# Patient Record
Sex: Male | Born: 1970 | Race: White | Hispanic: No | Marital: Married | Smoking: Current some day smoker
Health system: Southern US, Community
[De-identification: ages and names within clinical notes are randomized; demographics above are authoritative.]

## PROBLEM LIST (undated history)

## (undated) DIAGNOSIS — I2510 Atherosclerotic heart disease of native coronary artery without angina pectoris: Secondary | ICD-10-CM

## (undated) DIAGNOSIS — E785 Hyperlipidemia, unspecified: Secondary | ICD-10-CM

## (undated) DIAGNOSIS — E781 Pure hyperglyceridemia: Secondary | ICD-10-CM

## (undated) DIAGNOSIS — I10 Essential (primary) hypertension: Secondary | ICD-10-CM

## (undated) DIAGNOSIS — Z720 Tobacco use: Secondary | ICD-10-CM

---

## 2015-06-24 HISTORY — PX: ABLATION: SHX5711

## 2015-12-27 HISTORY — PX: CORONARY ARTERY BYPASS GRAFT: SHX141

## 2017-02-18 ENCOUNTER — Encounter (HOSPITAL_COMMUNITY): Payer: Self-pay

## 2017-02-18 ENCOUNTER — Inpatient Hospital Stay (HOSPITAL_COMMUNITY)
Admission: EM | Disposition: A | Discharge status: Home / Self Care | Payer: Self-pay | Source: Home / Self Care | Attending: Cardiovascular Disease

## 2017-02-18 ENCOUNTER — Emergency Department (HOSPITAL_COMMUNITY): Payer: Self-pay

## 2017-02-18 ENCOUNTER — Inpatient Hospital Stay (HOSPITAL_COMMUNITY)
Admission: EM | Admit: 2017-02-18 | Discharge: 2017-02-20 | DRG: 247 | Disposition: A | Discharge status: Home / Self Care | Payer: Self-pay | Attending: Cardiovascular Disease | Admitting: Cardiovascular Disease

## 2017-02-18 DIAGNOSIS — I10 Essential (primary) hypertension: Secondary | ICD-10-CM | POA: Diagnosis present

## 2017-02-18 DIAGNOSIS — I213 ST elevation (STEMI) myocardial infarction of unspecified site: Secondary | ICD-10-CM

## 2017-02-18 DIAGNOSIS — Z955 Presence of coronary angioplasty implant and graft: Secondary | ICD-10-CM

## 2017-02-18 DIAGNOSIS — K219 Gastro-esophageal reflux disease without esophagitis: Secondary | ICD-10-CM | POA: Diagnosis present

## 2017-02-18 DIAGNOSIS — E785 Hyperlipidemia, unspecified: Secondary | ICD-10-CM | POA: Diagnosis present

## 2017-02-18 DIAGNOSIS — F17210 Nicotine dependence, cigarettes, uncomplicated: Secondary | ICD-10-CM | POA: Diagnosis present

## 2017-02-18 DIAGNOSIS — Z79899 Other long term (current) drug therapy: Secondary | ICD-10-CM

## 2017-02-18 DIAGNOSIS — E781 Pure hyperglyceridemia: Secondary | ICD-10-CM | POA: Diagnosis present

## 2017-02-18 DIAGNOSIS — I2119 ST elevation (STEMI) myocardial infarction involving other coronary artery of inferior wall: Principal | ICD-10-CM | POA: Diagnosis present

## 2017-02-18 DIAGNOSIS — Z951 Presence of aortocoronary bypass graft: Secondary | ICD-10-CM

## 2017-02-18 DIAGNOSIS — Y832 Surgical operation with anastomosis, bypass or graft as the cause of abnormal reaction of the patient, or of later complication, without mention of misadventure at the time of the procedure: Secondary | ICD-10-CM | POA: Diagnosis present

## 2017-02-18 DIAGNOSIS — Z720 Tobacco use: Secondary | ICD-10-CM | POA: Diagnosis present

## 2017-02-18 DIAGNOSIS — I25810 Atherosclerosis of coronary artery bypass graft(s) without angina pectoris: Secondary | ICD-10-CM | POA: Diagnosis present

## 2017-02-18 DIAGNOSIS — I2111 ST elevation (STEMI) myocardial infarction involving right coronary artery: Secondary | ICD-10-CM

## 2017-02-18 DIAGNOSIS — T82855A Stenosis of coronary artery stent, initial encounter: Secondary | ICD-10-CM | POA: Diagnosis present

## 2017-02-18 DIAGNOSIS — I2510 Atherosclerotic heart disease of native coronary artery without angina pectoris: Secondary | ICD-10-CM

## 2017-02-18 DIAGNOSIS — Z7982 Long term (current) use of aspirin: Secondary | ICD-10-CM

## 2017-02-18 HISTORY — PX: LEFT HEART CATH AND CORONARY ANGIOGRAPHY: CATH118249

## 2017-02-18 HISTORY — DX: Essential (primary) hypertension: I10

## 2017-02-18 HISTORY — DX: Atherosclerotic heart disease of native coronary artery without angina pectoris: I25.10

## 2017-02-18 HISTORY — PX: CORONARY/GRAFT ACUTE MI REVASCULARIZATION: CATH118305

## 2017-02-18 HISTORY — DX: Pure hyperglyceridemia: E78.1

## 2017-02-18 HISTORY — DX: Hyperlipidemia, unspecified: E78.5

## 2017-02-18 HISTORY — DX: Tobacco use: Z72.0

## 2017-02-18 LAB — CBC
HCT: 49.8 % (ref 39.0–52.0)
Hemoglobin: 18 g/dL — ABNORMAL HIGH (ref 13.0–17.0)
MCH: 35.2 pg — AB (ref 26.0–34.0)
MCHC: 36.1 g/dL — AB (ref 30.0–36.0)
MCV: 97.3 fL (ref 78.0–100.0)
PLATELETS: 211 10*3/uL (ref 150–400)
RBC: 5.12 MIL/uL (ref 4.22–5.81)
RDW: 12.8 % (ref 11.5–15.5)
WBC: 11.3 10*3/uL — ABNORMAL HIGH (ref 4.0–10.5)

## 2017-02-18 LAB — BASIC METABOLIC PANEL
ANION GAP: 6 (ref 5–15)
BUN: 13 mg/dL (ref 6–20)
CO2: 24 mmol/L (ref 22–32)
CREATININE: 1.13 mg/dL (ref 0.61–1.24)
Calcium: 9.3 mg/dL (ref 8.9–10.3)
Chloride: 105 mmol/L (ref 101–111)
GLUCOSE: 145 mg/dL — AB (ref 65–99)
Potassium: 5.8 mmol/L — ABNORMAL HIGH (ref 3.5–5.1)
Sodium: 135 mmol/L (ref 135–145)

## 2017-02-18 LAB — I-STAT TROPONIN, ED: Troponin i, poc: 0.08 ng/mL (ref 0.00–0.08)

## 2017-02-18 LAB — POCT ACTIVATED CLOTTING TIME: ACTIVATED CLOTTING TIME: 510 s

## 2017-02-18 LAB — MRSA PCR SCREENING: MRSA BY PCR: NEGATIVE

## 2017-02-18 SURGERY — LEFT HEART CATH AND CORONARY ANGIOGRAPHY
Anesthesia: LOCAL

## 2017-02-18 MED ORDER — NITROGLYCERIN 1 MG/10 ML FOR IR/CATH LAB
INTRA_ARTERIAL | Status: AC
  Filled 2017-02-18: qty 10

## 2017-02-18 MED ORDER — SODIUM CHLORIDE 0.9 % IV SOLN
INTRAVENOUS | Status: DC | PRN
  Administered 2017-02-18 (×2): 1.75 mg/kg/h via INTRAVENOUS

## 2017-02-18 MED ORDER — HEPARIN SODIUM (PORCINE) 5000 UNIT/ML IJ SOLN
INTRAMUSCULAR | Status: AC
  Administered 2017-02-18: 4000 [IU]
  Filled 2017-02-18: qty 1

## 2017-02-18 MED ORDER — BIVALIRUDIN TRIFLUOROACETATE 250 MG IV SOLR
INTRAVENOUS | Status: AC
  Filled 2017-02-18: qty 250

## 2017-02-18 MED ORDER — ACETAMINOPHEN 325 MG PO TABS: 650.0000 mg | ORAL_TABLET | ORAL | Status: DC | PRN

## 2017-02-18 MED ORDER — METOPROLOL SUCCINATE ER 100 MG PO TB24
100.0000 mg | ORAL_TABLET | Freq: Every day | ORAL | Status: DC
  Administered 2017-02-19 – 2017-02-20 (×2): 100 mg via ORAL
  Filled 2017-02-18 (×2): qty 1

## 2017-02-18 MED ORDER — SODIUM CHLORIDE 0.9 % WEIGHT BASED INFUSION
1.0000 mL/kg/h | INTRAVENOUS | Status: AC
  Administered 2017-02-18 – 2017-02-19 (×2): 1 mL/kg/h via INTRAVENOUS

## 2017-02-18 MED ORDER — FENTANYL CITRATE (PF) 100 MCG/2ML IJ SOLN
INTRAMUSCULAR | Status: DC | PRN
  Administered 2017-02-18: 25 ug via INTRAVENOUS

## 2017-02-18 MED ORDER — IOPAMIDOL (ISOVUE-370) INJECTION 76%
INTRAVENOUS | Status: DC | PRN
  Administered 2017-02-18: 180 mL via INTRA_ARTERIAL

## 2017-02-18 MED ORDER — MIDAZOLAM HCL 2 MG/2ML IJ SOLN
INTRAMUSCULAR | Status: AC
  Filled 2017-02-18: qty 2

## 2017-02-18 MED ORDER — ATORVASTATIN CALCIUM 80 MG PO TABS: 80.0000 mg | ORAL_TABLET | Freq: Every day | ORAL | Status: DC

## 2017-02-18 MED ORDER — ASPIRIN 325 MG PO TABS: 325.0000 mg | ORAL_TABLET | Freq: Once | ORAL | Status: DC

## 2017-02-18 MED ORDER — LIDOCAINE HCL (PF) 1 % IJ SOLN
INTRAMUSCULAR | Status: AC
  Filled 2017-02-18: qty 30

## 2017-02-18 MED ORDER — SODIUM CHLORIDE 0.9% FLUSH
3.0000 mL | Freq: Two times a day (BID) | INTRAVENOUS | Status: DC
  Administered 2017-02-18 – 2017-02-20 (×3): 3 mL via INTRAVENOUS

## 2017-02-18 MED ORDER — ONDANSETRON HCL 4 MG/2ML IJ SOLN: 4.0000 mg | Freq: Four times a day (QID) | INTRAMUSCULAR | Status: DC | PRN

## 2017-02-18 MED ORDER — TICAGRELOR 90 MG PO TABS
90.0000 mg | ORAL_TABLET | Freq: Two times a day (BID) | ORAL | Status: DC
  Administered 2017-02-18 – 2017-02-20 (×4): 90 mg via ORAL
  Filled 2017-02-18 (×4): qty 1

## 2017-02-18 MED ORDER — NITROGLYCERIN 1 MG/10 ML FOR IR/CATH LAB
INTRA_ARTERIAL | Status: DC | PRN
  Administered 2017-02-18: 200 ug via INTRACORONARY

## 2017-02-18 MED ORDER — IOPAMIDOL (ISOVUE-370) INJECTION 76%
INTRAVENOUS | Status: AC
Start: 2017-02-18 — End: ?
  Filled 2017-02-18: qty 50

## 2017-02-18 MED ORDER — HEPARIN (PORCINE) IN NACL 2-0.9 UNIT/ML-% IJ SOLN
INTRAMUSCULAR | Status: AC
  Filled 2017-02-18: qty 1000

## 2017-02-18 MED ORDER — TICAGRELOR 90 MG PO TABS
ORAL_TABLET | ORAL | Status: AC
  Filled 2017-02-18: qty 2

## 2017-02-18 MED ORDER — MIDAZOLAM HCL 2 MG/2ML IJ SOLN
INTRAMUSCULAR | Status: DC | PRN
  Administered 2017-02-18: 1 mg via INTRAVENOUS

## 2017-02-18 MED ORDER — SODIUM CHLORIDE 0.9 % IV SOLN: 250.0000 mL | INTRAVENOUS | Status: DC | PRN

## 2017-02-18 MED ORDER — TICAGRELOR 90 MG PO TABS
ORAL_TABLET | ORAL | Status: DC | PRN
  Administered 2017-02-18: 180 mg via ORAL

## 2017-02-18 MED ORDER — ASPIRIN EC 81 MG PO TBEC
81.0000 mg | DELAYED_RELEASE_TABLET | Freq: Every day | ORAL | Status: DC
Start: 2017-02-19 — End: 2017-02-20
  Administered 2017-02-19 – 2017-02-20 (×2): 81 mg via ORAL
  Filled 2017-02-18 (×2): qty 1

## 2017-02-18 MED ORDER — BIVALIRUDIN BOLUS VIA INFUSION - CUPID
INTRAVENOUS | Status: DC | PRN
  Administered 2017-02-18: 95.925 mg via INTRAVENOUS

## 2017-02-18 MED ORDER — IOPAMIDOL (ISOVUE-370) INJECTION 76%
INTRAVENOUS | Status: AC
  Filled 2017-02-18: qty 125

## 2017-02-18 MED ORDER — ENOXAPARIN SODIUM 40 MG/0.4ML ~~LOC~~ SOLN
40.0000 mg | SUBCUTANEOUS | Status: DC
  Administered 2017-02-19: 40 mg via SUBCUTANEOUS
  Filled 2017-02-18 (×2): qty 0.4

## 2017-02-18 MED ORDER — VERAPAMIL HCL 2.5 MG/ML IV SOLN
INTRAVENOUS | Status: AC
  Filled 2017-02-18: qty 2

## 2017-02-18 MED ORDER — MORPHINE SULFATE (PF) 4 MG/ML IV SOLN
4.0000 mg | Freq: Once | INTRAVENOUS | Status: AC
  Administered 2017-02-18: 4 mg via INTRAVENOUS
  Filled 2017-02-18: qty 1

## 2017-02-18 MED ORDER — NITROGLYCERIN IN D5W 200-5 MCG/ML-% IV SOLN
0.0000 ug/min | Freq: Once | INTRAVENOUS | Status: AC
  Administered 2017-02-18: 5 ug/min via INTRAVENOUS
  Filled 2017-02-18: qty 250

## 2017-02-18 MED ORDER — LIDOCAINE HCL (PF) 1 % IJ SOLN
INTRAMUSCULAR | Status: DC | PRN
  Administered 2017-02-18: 18 mL

## 2017-02-18 MED ORDER — SODIUM CHLORIDE 0.9% FLUSH: 3.0000 mL | INTRAVENOUS | Status: DC | PRN

## 2017-02-18 MED ORDER — FENTANYL CITRATE (PF) 100 MCG/2ML IJ SOLN
INTRAMUSCULAR | Status: AC
  Filled 2017-02-18: qty 2

## 2017-02-18 SURGICAL SUPPLY — 15 items
BALLN EMERGE MR 2.5X12 (BALLOONS) ×2
BALLN SAPPHIRE ~~LOC~~ 3.25X12 (BALLOONS) ×2 IMPLANT
BALLOON EMERGE MR 2.5X12 (BALLOONS) ×1 IMPLANT
CATH INFINITI 5 FR IM (CATHETERS) ×2 IMPLANT
CATH INFINITI 5FR MULTPACK ANG (CATHETERS) ×2 IMPLANT
CATH VISTA GUIDE 6FR JR4 (CATHETERS) ×2 IMPLANT
KIT HEART LEFT (KITS) ×2 IMPLANT
PACK CARDIAC CATHETERIZATION (CUSTOM PROCEDURE TRAY) ×2 IMPLANT
SHEATH PINNACLE 6F 10CM (SHEATH) ×2 IMPLANT
STENT SIERRA 3.00 X 18 MM (Permanent Stent) ×2 IMPLANT
TRANSDUCER W/STOPCOCK (MISCELLANEOUS) ×2 IMPLANT
TUBING CIL FLEX 10 FLL-RA (TUBING) ×2 IMPLANT
WIRE EMERALD 3MM-J .035X150CM (WIRE) ×2 IMPLANT
WIRE EMERALD 3MM-J .035X260CM (WIRE) ×2 IMPLANT
WIRE RUNTHROUGH .014X180CM (WIRE) ×2 IMPLANT

## 2017-02-18 NOTE — H&P (Signed)
History & Physical    Patient ID: Danny Huber MRN: [id number], DOB/AGE: [birth year]   Admit date: 02/18/2017   Primary Physician: No primary care provider on file. Primary Cardiologist: [place]   Patient Profile    46 yo male with PMH of CAD s/p CABG 7/17 with ablation, HTN, and tobacco use who presented to the ED with chest pain. Code STEMI called.   Past Medical History   Past Medical History:  Diagnosis Date  . Angina pectoris syndrome (HCC)   . CAD (coronary artery disease)    7/17 CABG in [place]   . Hypertension   . Tobacco use     Past Surgical History:  Procedure Laterality Date  . ABLATION  2017   "2 weeks after the CABG"  . CORONARY ARTERY BYPASS GRAFT  12/27/2015     Allergies  No Known Allergies  History of Present Illness    [name] is a 46 yo male with PMH of CAD s/p CABG and ablation back in July of 2017 according to the patient. Other hx includes HTN and tobacco use. 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 Reports he developed chest pain this morning around 6am that awoke him from sleep and worsened when he was getting ready to take a shower. States the pain persisted which prompted him to call EMS present to the ED. Initial EKG on arrival showed SR with no acute ST changes. Repeat EKG at 1452 showed SR with ST elevation in the inferior leads. Code STEMI called in the ED. Given 324 asa, 4000 units of heparin, IV morphine and started on IV nitro. He was brought to the cath lab for emergent cardiac catheterization.   Home Medications    Prior to Admission medications   Medication Sig Start Date End Date Taking? Authorizing Provider  aspirin EC 81 MG tablet Take 81 mg by mouth daily.   Yes [provider]  isosorbide dinitrate (ISORDIL) 30 MG tablet Take 30 mg by mouth daily.    Yes [provider]  metoprolol succinate (TOPROL-XL) 100 MG 24 hr tablet Take 100 mg by mouth daily. Take with or  immediately following a meal.   Yes [provider]  sotalol (BETAPACE) 80 MG tablet Take 80 mg by mouth 2 (two) times daily.    Yes [provider]    Family History    Family History  Problem Relation Age of Onset  . Hypertension Father     Social History    Social History   Social History  . Marital status: N/A    Spouse name: N/A  . Number of children: N/A  . Years of education: N/A   Occupational History  . Not on file.   Social History Main Topics  . Smoking status: Current Some Day Smoker    Packs/day: 0.25    Types: Cigarettes  . Smokeless tobacco: Never Used  . Alcohol use No  . Drug use: No  . Sexual activity: Not on file   Other Topics Concern  . Not on file   Social History Narrative  . No narrative on file     Review of Systems    See HPI  All other systems reviewed and are otherwise negative except as noted above.  Physical Exam    Blood pressure 133/87, pulse 67, temperature 98 F (36.7 C), temperature source Oral, resp. rate 13, height 6\' 1"  (1.854 m), weight 282 lb (127.9 kg), SpO2 99 %.  General: Pleasant, obese WM NAD Psych: Normal affect. Neuro: Alert and oriented X 3. Moves all extremities spontaneously. HEENT: Normal  Neck: Supple without bruits or JVD. Lungs:  Resp regular and unlabored, CTA. Heart: RRR no s3, s4, or murmurs. Abdomen: Soft, non-tender, non-distended, BS + x 4.  Extremities: No clubbing, cyanosis or edema. DP/PT/Radials 2+ and equal bilaterally.  Labs    Troponin (Point of Care Test)  Recent Labs  02/18/17 1355  TROPIPOC 0.08   No results for input(s): CKTOTAL, CKMB, TROPONINI in the last 72 hours. Lab Results  Component Value Date   WBC 11.3 (H) 02/18/2017   HGB 18.0 (H) 02/18/2017   HCT 49.8 02/18/2017   MCV 97.3 02/18/2017   PLT 211 02/18/2017     Recent Labs Lab 02/18/17 1345  NA 135  K 5.8*  CL 105  CO2 24  BUN 13  CREATININE 1.13  CALCIUM 9.3  GLUCOSE 145*   No  results found for: CHOL, HDL, LDLCALC, TRIG No results found for: [facility]   Radiology Studies    Dg Chest 2 View  Result Date: 02/18/2017 CLINICAL DATA:  Chest pain EXAM: CHEST  2 VIEW COMPARISON:  None. FINDINGS: Intact sternotomy wires. CABG clips overlie the left mediastinum. Normal heart size. Normal mediastinal contour. No pneumothorax. No pleural effusion. Lungs appear clear, with no acute consolidative airspace disease and no pulmonary edema. IMPRESSION: No active cardiopulmonary disease. Electronically Signed   By: Delbert Phenix M.D.   On: 02/18/2017 14:21    ECG & Cardiac Imaging    EKG: SR with inferior ST elevation  Assessment & Plan    46 yo male with PMH of CAD s/p CABG 7/17 with ablation, HTN, and tobacco use who presented to the ED with chest pain. Code STEMI called.  STEMI: Presented with chest pain that started around 6am which persisted prompting him to come to the ED. Initial EKG showed SR with no ST changes, but follow up at 1452 showed inferior ST elevation. Given 324 asa, morphine, IV heparin and nitro. Brought to the lab for emergent cardiac cath.  -- further recommendations post cath  Signed, Laverda Page, NP-C Pager [phone] 02/18/2017, 3:42 PM

## 2017-02-18 NOTE — ED Provider Notes (Signed)
[facility] DE[facility] Provider Note   CSN: [id number] Arrival date & time: 02/18/17  1323     History   Chief Complaint Chief Complaint  Patient presents with  . Chest Pain    HPI Danny Huber is a 46 y.o. male with a history of CABG presents for chest pain that started at 6 AM. He reports his chest pain woke him a sleep.  He reports that he had one nitro with out relief.  He reports it is non radiating, does not change with breathing.  He reports that last year he had a CABG in [place] and needed an ablation after.  He reports that prior to having his CABG he had stents put in. He denies any N/V or SOB. He reports that he had a history of GERD, and did not take his medicine today.      HPI  Past Medical History:  Diagnosis Date  . Angina pectoris syndrome (HCC)   . Hypertension     There are no active problems to display for this patient.   Past Surgical History:  Procedure Laterality Date  . ABLATION  2017   "2 weeks after the CABG"  . CORONARY ARTERY BYPASS GRAFT  12/27/2015       Home Medications    Prior to Admission medications   Medication Sig Start Date End Date Taking? Authorizing Provider  aspirin EC 81 MG tablet Take 81 mg by mouth daily.   Yes [provider]  isosorbide dinitrate (ISORDIL) 30 MG tablet Take 30 mg by mouth daily.    Yes [provider]  metoprolol succinate (TOPROL-XL) 100 MG 24 hr tablet Take 100 mg by mouth daily. Take with or immediately following a meal.   Yes [provider]  sotalol (BETAPACE) 80 MG tablet Take 80 mg by mouth 2 (two) times daily.    Yes [provider]    Family History No family history on file.  Social History Social History  Substance Use Topics  . Smoking status: Current Some Day Smoker    Packs/day: 0.25    Types: Cigarettes  . Smokeless tobacco: Never Used  . Alcohol use No     Allergies   Patient has no known allergies.   Review of Systems Review of Systems   Constitutional: Negative for chills and fever.  HENT: Negative for ear pain and sore throat.   Eyes: Negative for pain and visual disturbance.  Respiratory: Positive for chest tightness. Negative for cough and shortness of breath.   Cardiovascular: Positive for chest pain. Negative for palpitations and leg swelling.  Gastrointestinal: Negative for abdominal pain, nausea and vomiting.  Genitourinary: Negative for dysuria and hematuria.  Musculoskeletal: Negative for arthralgias and back pain.  Skin: Negative for color change and rash.  Neurological: Negative for seizures and syncope.  All other systems reviewed and are negative.    Physical Exam Updated Vital Signs BP 133/87 (BP Location: Right Arm)   Pulse 67   Temp 98 F (36.7 C) (Oral)   Resp 13   Ht 6\' 1"  (1.854 m)   Wt 127.9 kg (282 lb)   SpO2 95%   BMI 37.21 kg/m   Physical Exam  Constitutional: He appears well-developed and well-nourished.  HENT:  Head: Normocephalic and atraumatic.  Eyes: Conjunctivae are normal.  Neck: Neck supple.  Cardiovascular: Normal rate, regular rhythm, normal heart sounds and intact distal pulses.  Exam reveals no gallop and no friction rub.   No murmur heard. Large midline  chest scar present.   Pulmonary/Chest: Effort normal and breath sounds normal. No respiratory distress. He has no wheezes. He exhibits no tenderness.  Abdominal: Soft. He exhibits no distension. There is no tenderness.  Musculoskeletal: He exhibits no edema.  Neurological: He is alert.  Skin: Skin is warm and dry.  Psychiatric: He has a normal mood and affect. His behavior is normal.  Nursing note and vitals reviewed.    ED Treatments / Results  Labs (all labs ordered are listed, but only abnormal results are displayed) Labs Reviewed  BASIC METABOLIC PANEL - Abnormal; Notable for the following:       Result Value   Potassium 5.8 (*)    Glucose, Bld 145 (*)    All other components within normal limits  CBC -  Abnormal; Notable for the following:    WBC 11.3 (*)    Hemoglobin 18.0 (*)    MCH 35.2 (*)    MCHC 36.1 (*)    All other components within normal limits  I-STAT TROPONIN, ED    EKG  EKG Interpretation  Date/Time:  Wednesday February 18 2017 14:52:18 EDT Ventricular Rate:  64 PR Interval:    QRS Duration: 98 QT Interval:  431 QTC Calculation: 445 R Axis:   104 Text Interpretation:  Sinus rhythm Inferior infarct, acute (RCA) Probable RV involvement, suggest recording right precordial leads +STEMI  Confirmed by Richardean CanalYao, David H [phone]([id number]) on 02/18/2017 2:58:05 PM       Radiology Dg Chest 2 View  Result Date: 02/18/2017 CLINICAL DATA:  Chest pain EXAM: CHEST  2 VIEW COMPARISON:  None. FINDINGS: Intact sternotomy wires. CABG clips overlie the left mediastinum. Normal heart size. Normal mediastinal contour. No pneumothorax. No pleural effusion. Lungs appear clear, with no acute consolidative airspace disease and no pulmonary edema. IMPRESSION: No active cardiopulmonary disease. Electronically Signed   By: Delbert PhenixJason A Poff M.D.   On: 02/18/2017 14:21    Procedures Procedures (including critical care time) CRITICAL CARE Performed by: Lyndel SafeElizabeth Athziry Millican Total critical care time: 33 minutes Critical care time was exclusive of separately billable procedures and treating other patients. Critical care was necessary to treat or prevent imminent or life-threatening deterioration. Critical care was time spent personally by me on the following activities: development of treatment plan with patient and/or surrogate as well as nursing, discussions with consultants, evaluation of patient's response to treatment, examination of patient, obtaining history from patient or surrogate, ordering and performing treatments and interventions, ordering and review of laboratory studies, ordering and review of radiographic studies, pulse oximetry and re-evaluation of patient's condition.   STEMI with urgent cardiac cath.     Medications Ordered in ED Medications  nitroGLYCERIN 50 mg in dextrose 5 % 250 mL (0.2 mg/mL) infusion (not administered)  heparin 5000 UNIT/ML injection (not administered)  aspirin tablet 325 mg (not administered)  morphine 4 MG/ML injection 4 mg (4 mg Intravenous Given 02/18/17 1501)     Initial Impression / Assessment and Plan / ED Course  I have reviewed the triage vital signs and the nursing notes.  Pertinent labs & imaging results that were available during my care of the patient were reviewed by me and considered in my medical decision making (see chart for details).  Clinical Course as of Feb 19 1504  Wed Feb 18, 2017  1418 Attempted to see patient, he has not in the room at this time.  [EH]    Clinical Course User Index [EH] Cristina GongHammond, Jesten Cappuccio W, PA-C   Jule Economyoger Davison  presents with chest pain that woke him up at 6 AM this morning.  Initial EKG and troponin were equivocal, labs were reviewed.  Repeat EKG was ordered based on high suspicion for ACS which showed Inferior STEMI.  Dr. Silverio Lay called code STEMI and started heparin, and nitro drip.  Dr. Silverio Lay consulted the code STEMI team who are planning to take patient to cath lab.  Patient was not hypotensive, he was on close cardiopulmonary monitoring while in the ED.    This patient was seen as a shared visit with Dr. Silverio Lay who evaluated the patient and agreed with my plan.     Final Clinical Impressions(s) / ED Diagnoses   Final diagnoses:  ST elevation myocardial infarction (STEMI), unspecified artery [facility])    New Prescriptions New Prescriptions   No medications on file     Norman Clay 02/18/17 1826    Charlynne Pander, MD 02/19/17 2158

## 2017-02-18 NOTE — ED Notes (Signed)
Code STEMi @14 :56

## 2017-02-18 NOTE — ED Triage Notes (Signed)
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 Pt hx of CABG last year, cath with stents. Pain now 2/10. 324 ASA, 1 Nitro without relief. 18G LAC, pt a/o VSS

## 2017-02-19 ENCOUNTER — Encounter (HOSPITAL_COMMUNITY): Payer: Self-pay

## 2017-02-19 ENCOUNTER — Inpatient Hospital Stay (HOSPITAL_COMMUNITY): Payer: Self-pay

## 2017-02-19 DIAGNOSIS — E785 Hyperlipidemia, unspecified: Secondary | ICD-10-CM

## 2017-02-19 DIAGNOSIS — I2119 ST elevation (STEMI) myocardial infarction involving other coronary artery of inferior wall: Secondary | ICD-10-CM

## 2017-02-19 DIAGNOSIS — Z716 Tobacco abuse counseling: Secondary | ICD-10-CM

## 2017-02-19 LAB — ECHOCARDIOGRAM COMPLETE
HEIGHTINCHES: 73 in
Weight: 4511.49 oz

## 2017-02-19 LAB — BASIC METABOLIC PANEL
ANION GAP: 10 (ref 5–15)
BUN: 9 mg/dL (ref 6–20)
CO2: 22 mmol/L (ref 22–32)
Calcium: 9.2 mg/dL (ref 8.9–10.3)
Chloride: 106 mmol/L (ref 101–111)
Creatinine, Ser: 0.95 mg/dL (ref 0.61–1.24)
GFR calc Af Amer: >60 mL/min (ref >60)
Glucose, Bld: 170 mg/dL — ABNORMAL HIGH (ref 65–99)
POTASSIUM: 3.9 mmol/L (ref 3.5–5.1)
SODIUM: 138 mmol/L (ref 135–145)

## 2017-02-19 LAB — CBC
HEMATOCRIT: 50.2 % (ref 39.0–52.0)
Hemoglobin: 17.5 g/dL — ABNORMAL HIGH (ref 13.0–17.0)
MCH: 34 pg (ref 26.0–34.0)
MCHC: 34.9 g/dL (ref 30.0–36.0)
MCV: 97.5 fL (ref 78.0–100.0)
Platelets: 180 10*3/uL (ref 150–400)
RBC: 5.15 MIL/uL (ref 4.22–5.81)
RDW: 13.1 % (ref 11.5–15.5)
WBC: 11.2 10*3/uL — ABNORMAL HIGH (ref 4.0–10.5)

## 2017-02-19 LAB — TROPONIN I: Troponin I: 2.05 ng/mL (ref <0.03)

## 2017-02-19 LAB — POCT ACTIVATED CLOTTING TIME: Activated Clotting Time: 153 seconds

## 2017-02-19 MED ORDER — LOSARTAN POTASSIUM 25 MG PO TABS
25.0000 mg | ORAL_TABLET | Freq: Every day | ORAL | Status: DC
  Administered 2017-02-19 – 2017-02-20 (×2): 25 mg via ORAL
  Filled 2017-02-19 (×2): qty 1

## 2017-02-19 MED ORDER — ROSUVASTATIN CALCIUM 20 MG PO TABS
40.0000 mg | ORAL_TABLET | Freq: Every day | ORAL | Status: DC
  Administered 2017-02-19 – 2017-02-20 (×2): 40 mg via ORAL
  Filled 2017-02-19 (×2): qty 2

## 2017-02-19 MED FILL — Heparin Sodium (Porcine) 2 Unit/ML in Sodium Chloride 0.9%: INTRAMUSCULAR | Qty: 500 | Status: AC

## 2017-02-19 MED FILL — Verapamil HCl IV Soln 2.5 MG/ML: INTRAVENOUS | Qty: 2 | Status: AC

## 2017-02-19 NOTE — Progress Notes (Addendum)
Progress Note  Patient Name: Danny Huber Date of Encounter: 02/19/2017  [facility]ologist: [facility]   Subjective   Angry this morning. Wanting to leave to go back to [place].   Inpatient Medications    Scheduled Meds: . aspirin EC  81 mg Oral Daily  . aspirin  325 mg Oral Once  . atorvastatin  80 mg Oral q1800  . enoxaparin (LOVENOX) injection  40 mg Subcutaneous Q24H  . metoprolol succinate  100 mg Oral Daily  . sodium chloride flush  3 mL Intravenous Q12H  . ticagrelor  90 mg Oral BID   Continuous Infusions: . sodium chloride Stopped (02/19/17 0444)   PRN Meds: sodium chloride, acetaminophen, ondansetron (ZOFRAN) IV, sodium chloride flush   Vital Signs    Vitals:   02/19/17 0015 02/19/17 0300 02/19/17 0425 02/19/17 0845  BP: 128/80  (!) 156/99 133/84  Pulse: 73 87 85 86  Resp: 19 12 18 18   Temp: 97.9 F (36.6 C)  (!) 97.4 F (36.3 C) 98.1 F (36.7 C)  TempSrc: Oral  Oral Oral  SpO2: 95% 93% 96% 96%  Weight:      Height:        Intake/Output Summary (Last 24 hours) at 02/19/17 1205 Last data filed at 02/19/17 0846  Gross per 24 hour  Intake          2525.26 ml  Output                0 ml  Net          2525.26 ml   Filed Weights   02/18/17 1335 02/18/17 1706  Weight: 282 lb (127.9 kg) 281 lb 15.5 oz (127.9 kg)    Telemetry    SR - Personally Reviewed  ECG    SR with Q waves in lead III - Personally Reviewed  Physical Exam   General: Well developed, well nourished, male appearing in no acute distress. Head: Normocephalic, atraumatic.  Neck: Supple without bruits, JVD. Lungs:  Resp regular and unlabored, CTA. Heart: RRR, S1, S2, no S3, S4, or murmur; no rub. Abdomen: Soft, non-tender, non-distended with normoactive bowel sounds. No hepatomegaly. No rebound/guarding. No obvious abdominal masses. Extremities: No clubbing, cyanosis, edema. Distal pedal pulses are 2+ bilaterally. Right femoral cath site stable.  Neuro: Alert and oriented X  3. Moves all extremities spontaneously. Psych: Normal affect.  Labs    Chemistry Recent Labs Lab 02/18/17 1345 02/19/17 0519  NA 135 138  K 5.8* 3.9  CL 105 106  CO2 24 22  GLUCOSE 145* 170*  BUN 13 9  CREATININE 1.13 0.95  CALCIUM 9.3 9.2  GFRNONAA >60 >60  GFRAA >60 >60  ANIONGAP 6 10     Hematology Recent Labs Lab 02/18/17 1345 02/19/17 0519  WBC 11.3* 11.2*  RBC 5.12 5.15  HGB 18.0* 17.5*  HCT 49.8 50.2  MCV 97.3 97.5  MCH 35.2* 34.0  MCHC 36.1* 34.9  RDW 12.8 13.1  PLT 211 180    Cardiac EnzymesNo results for input(s): TROPONINI in the last 168 hours.  Recent Labs Lab 02/18/17 1355  TROPIPOC 0.08     BNPNo results for input(s): BNP, PROBNP in the last 168 hours.   DDimer No results for input(s): DDIMER in the last 168 hours.    Radiology    Dg Chest 2 View  Result Date: 02/18/2017 CLINICAL DATA:  Chest pain EXAM: CHEST  2 VIEW COMPARISON:  None. FINDINGS: Intact sternotomy wires. CABG clips overlie the left  mediastinum. Normal heart size. Normal mediastinal contour. No pneumothorax. No pleural effusion. Lungs appear clear, with no acute consolidative airspace disease and no pulmonary edema. IMPRESSION: No active cardiopulmonary disease. Electronically Signed   By: Delbert Phenix M.D.   On: 02/18/2017 14:21    Cardiac Studies   LHC: 02/18/17  Conclusion     Mid Cx to Dist Cx lesion, 30 %stenosed.  2nd Mrg lesion, 100 %stenosed.  Ost Ramus to Ramus lesion, 100 %stenosed.  Prox LAD to Mid LAD lesion, 40 %stenosed.  Prox LAD lesion, 70 %stenosed.  Prox RCA lesion, 40 %stenosed.  A drug eluting stent was successfully placed.  Ost RPDA lesion, 100 %stenosed.  Post intervention, there is a 0% residual stenosis.  SVG.  Origin lesion, 100 %stenosed.  LIMA and is anatomically normal.   1. Severe underlying three-vessel coronary artery disease with patent LIMA to LAD and patent stent in the left circumflex. Occluded ramus stent appears  to be chronic and occluded SVG to what seems to be a ramus branch which is also chronic. There is acute occlusion of the ostial right   PDA which is the culprit for inferior ST elevation myocardial infarction. No obvious patent graft to the right coronary artery.  2. Normal left ventricular end-diastolic pressure. Left ventricular angiography was not performed due to mildly elevated potassium. 3. Successful angioplasty and drug-eluting stent placement to the right PDA.  Recommendations: Dual antiplatelet therapy for at least one year. I added high dose atorvastatin. I ordered an echocardiogram to evaluate LV systolic function. Smoking cessation is strongly advised.   TTE: pending  Patient Profile     46 y.o. male with PMH of multiple stents, 2v CABG (7/17) with ablation afterwards, HTN, HL and tobacco use who presented as a STEMI.   Assessment & Plan    1. STEMI: Underwent cardiac cath with Dr. Kirke Corin with 1/2 patent grafts. PCI/DES x1 to right PDA. Plan for DAPT with ASA/Brilinta. Morning labs stable. -- echo pending -- check follow up troponin -- BB, asa, statin, add losartan today   2. HTN: Recs above  3. HL: check lipids in the am -- was not on statin therapy prior to admission -- Lipitor 80mg  daily  4. Tobacco Use: Reports smoking 3 cigarettes a week.   Disp: Patient was very adamant about being discharged to go back to [place] today. Had a long discussion with the patient about the risk involved with leaving at this time. He does not currently have a cardiologist at home. States he stopped going because he was unhappy with them. Stressed the need for close follow up at home with cardiology given PCI/DES. Hopefully will stay through the day. CM assisting with medications and cardiology referral.   Signed, Laverda Page, NP  02/19/2017, 12:05 PM  Pager # [phone]    Patient seen and examined. Agree with assessment and plan. Long discussion with patient. Reviewed  angiographic findings in detail. Prior to CABG 12/26/2016 he had been a 3 ppd smoker.  Has had recurrent chest pain since CABG. Day 1 s/p STEMI 2/2/ PDA occlusion treated with DES stent. Occluded SVG to OM.  Will add imdur 30 mg daily to regimen, metoprolol succinate 100 mg daily, ASA/Brilinta/ Atorvastatin 80 mg. Echo just completed, results pending. ECG today indepentently reviewed shows NSR at 78; Q wave with T inversion in 3, aVF c/w IMI. Discussed staying in hospital today with probable dc tomorrow if remains stable. Counseled on smoking cessation.   Lennette Bihari, MD,  [facility] 02/19/2017 12:29 PM

## 2017-02-19 NOTE — Progress Notes (Signed)
  Echocardiogram 2D Echocardiogram has been performed.  Danny Huber 02/19/2017, 10:47 AM

## 2017-02-19 NOTE — Care Management Note (Addendum)
Case Management Note  Patient Details  Name: Danny EconomyRoger Arcidiacono MRN: [id number] Date of Birth: 1970-09-23  Subjective/Objective:    Pt admitted with STEMI                Action/Plan:   PTA independent from home with wife - pt is traveling from [place].  Pt will discharge home on Brilinta without insurance.  CM provided free 30day Brilinta card and contacted [employer] at bedside to request assistance.     Expected Discharge Date:                  Expected Discharge Plan:  Home/Self Care  In-House Referral:     Discharge planning Services  CM Consult, Medication Assistance  Post Acute Care Choice:    Choice offered to:     DME Arranged:    DME Agency:     HH Arranged:    HH Agency:     Status of Service:     If discussed at [employer] Meetings, dates discussed:    Additional Comments: Pt completed initial survey for medication assistance via phone while CM was present - Pt provide necessary information - program to mail pt a 30 day supply to his home.  Program also faxed remainder of application - CM provided faxed material directly to pt and instructed pt to fill out ASAP and mail back to company.  Pt has also contacted PCP Dr Ferd Glassingheryl White of [place] and requested appt once he arrives back in the area.  [facility] actively working to find pt cardiologist in the [place] arrive.  Pt will pick up Brilinta and other  pres[facility] - address provided on AVS.  Pt states that his other medications are in [place] - CM provided MATCH letter with explaination Cherylann ParrClaxton, Dolora Ridgely S, RN 02/19/2017, 11:47 AM

## 2017-02-19 NOTE — Progress Notes (Signed)
CARDIAC REHAB PHASE I   PRE:  Rate/Rhythm: 83 SR  BP:  Supine:   Sitting: 140/96  Standing:    SaO2: 95%RA  MODE:  Ambulation: 550 ft   POST:  Rate/Rhythm: 98 SR  BP:  Supine:   Sitting: 144/96  Standing:    SaO2: 95%RA 1015-1110 Pt walked 550 ft on RA with steady gait and no CP. Pt stated he is ready to go home. MI education completed with pt who voiced understanding. Stressed importance of brilinta with stent. Pt needs to see case manager as he has no insurance. Reviewed NTG use, MI restrictions, heart healthy food choices and ex ed. Pt gave me the names of the hospitals in [place] so I will make CRP 2 referral to one of them.  Discussed with  RN that pt stated he does not want to stay another night in hospital and that he needs to see case manager.  Also discussed smoking cessation and gave pt fake cigarette. Pt stated he smokes about 2 to 3 cigarettes a week and plans to quit cold [place].   Luetta Nuttingharlene Antwaine Boomhower, RN BSN  02/19/2017 11:07 AM

## 2017-02-20 ENCOUNTER — Encounter (HOSPITAL_COMMUNITY): Payer: Self-pay | Admitting: Physician Assistant

## 2017-02-20 DIAGNOSIS — E785 Hyperlipidemia, unspecified: Secondary | ICD-10-CM | POA: Diagnosis present

## 2017-02-20 DIAGNOSIS — Z720 Tobacco use: Secondary | ICD-10-CM | POA: Diagnosis present

## 2017-02-20 DIAGNOSIS — I10 Essential (primary) hypertension: Secondary | ICD-10-CM | POA: Diagnosis present

## 2017-02-20 DIAGNOSIS — I2119 ST elevation (STEMI) myocardial infarction involving other coronary artery of inferior wall: Principal | ICD-10-CM

## 2017-02-20 DIAGNOSIS — I2510 Atherosclerotic heart disease of native coronary artery without angina pectoris: Secondary | ICD-10-CM | POA: Diagnosis present

## 2017-02-20 DIAGNOSIS — E782 Mixed hyperlipidemia: Secondary | ICD-10-CM

## 2017-02-20 DIAGNOSIS — E781 Pure hyperglyceridemia: Secondary | ICD-10-CM | POA: Diagnosis present

## 2017-02-20 LAB — BASIC METABOLIC PANEL WITH GFR
Anion gap: 8 (ref 5–15)
BUN: 12 mg/dL (ref 6–20)
CO2: 25 mmol/L (ref 22–32)
Calcium: 9.6 mg/dL (ref 8.9–10.3)
Chloride: 105 mmol/L (ref 101–111)
Creatinine, Ser: 1.07 mg/dL (ref 0.61–1.24)
GFR calc Af Amer: >60 mL/min
GFR calc non Af Amer: >60 mL/min
Glucose, Bld: 151 mg/dL — ABNORMAL HIGH (ref 65–99)
Potassium: 3.9 mmol/L (ref 3.5–5.1)
Sodium: 138 mmol/L (ref 135–145)

## 2017-02-20 LAB — LIPID PANEL
CHOLESTEROL: 188 mg/dL (ref 0–200)
HDL: 24 mg/dL — AB (ref >40)
LDL CALC: UNDETERMINED mg/dL (ref 0–99)
TRIGLYCERIDES: 501 mg/dL — AB (ref <150)
Total CHOL/HDL Ratio: 7.8 RATIO
VLDL: UNDETERMINED mg/dL (ref 0–40)

## 2017-02-20 MED ORDER — OMEGA-3-ACID ETHYL ESTERS 1 G PO CAPS: 2.0000 g | ORAL_CAPSULE | Freq: Two times a day (BID) | ORAL | 1 refills | Status: AC

## 2017-02-20 MED ORDER — NITROGLYCERIN 0.4 MG SL SUBL: 0.4000 mg | SUBLINGUAL_TABLET | SUBLINGUAL | 3 refills | Status: AC | PRN

## 2017-02-20 MED ORDER — TICAGRELOR 90 MG PO TABS: 90.0000 mg | ORAL_TABLET | Freq: Two times a day (BID) | ORAL | 10 refills | Status: AC

## 2017-02-20 MED ORDER — LOSARTAN POTASSIUM 25 MG PO TABS: 25.0000 mg | ORAL_TABLET | Freq: Every day | ORAL | 1 refills | Status: DC

## 2017-02-20 MED ORDER — ISOSORBIDE MONONITRATE ER 30 MG PO TB24
30.0000 mg | ORAL_TABLET | Freq: Every day | ORAL | Status: DC
  Administered 2017-02-20: 30 mg via ORAL
  Filled 2017-02-20 (×2): qty 1

## 2017-02-20 MED ORDER — ROSUVASTATIN CALCIUM 40 MG PO TABS: 40.0000 mg | ORAL_TABLET | Freq: Every evening | ORAL | 1 refills | Status: AC

## 2017-02-20 MED ORDER — ISOSORBIDE MONONITRATE ER 30 MG PO TB24: 30.0000 mg | ORAL_TABLET | Freq: Every day | ORAL | 1 refills | Status: AC

## 2017-02-20 NOTE — Care Management Note (Signed)
Case Management Note  Patient Details  Name: Jule EconomyRoger Sirmons MRN: [id number] Date of Birth: 1970-12-20  Subjective/Objective:                    Action/Plan: Pt discharging home with self care. Pt has Brilinta 30 day free card and MATCH letter. Pt has transportation home.   Expected Discharge Date:  02/20/17               Expected Discharge Plan:  Home/Self Care  In-House Referral:     Discharge planning Services  CM Consult, Medication Assistance, [facility]  Post Acute Care Choice:    Choice offered to:     DME Arranged:    DME Agency:     HH Arranged:    HH Agency:     Status of Service:  Completed, signed off  If discussed at [employer] Meetings, dates discussed:    Additional Comments:  Kermit BaloKelli F Matthieu Loftus, RN 02/20/2017, 2:22 PM

## 2017-02-20 NOTE — Progress Notes (Signed)
Progress Note  Patient Name: Danny Huber Date of Encounter: 02/20/2017  Primary Cardiologist: lives in [place]  Subjective   No recurrent chest pain.  Inpatient Medications    Scheduled Meds: . aspirin EC  81 mg Oral Daily  . aspirin  325 mg Oral Once  . enoxaparin (LOVENOX) injection  40 mg Subcutaneous Q24H  . losartan  25 mg Oral Daily  . metoprolol succinate  100 mg Oral Daily  . rosuvastatin  40 mg Oral q1800  . sodium chloride flush  3 mL Intravenous Q12H  . ticagrelor  90 mg Oral BID   Continuous Infusions: . sodium chloride Stopped (02/19/17 0444)   PRN Meds: sodium chloride, acetaminophen, ondansetron (ZOFRAN) IV, sodium chloride flush   Vital Signs    Vitals:   02/19/17 1707 02/19/17 1940 02/20/17 0354 02/20/17 0742  BP: 137/89  (!) 113/51 119/68  Pulse: 78     Resp: 18  (!) 27 16  Temp: 97.7 F (36.5 C) (!) 97.5 F (36.4 C) 97.9 F (36.6 C) 98.3 F (36.8 C)  TempSrc: Oral Oral Oral Oral  SpO2: 91%  92% 96%  Weight:      Height:        Intake/Output Summary (Last 24 hours) at 02/20/17 1210 Last data filed at 02/20/17 0900  Gross per 24 hour  Intake              960 ml  Output                0 ml  Net              960 ml    I/O since admission:  +3485  Filed Weights   02/18/17 1335 02/18/17 1706  Weight: 282 lb (127.9 kg) 281 lb 15.5 oz (127.9 kg)    Telemetry    Sinus rhythm - Personally Reviewed  ECG    ECG (independently read by me):  Physical Exam   BP 119/68 (BP Location: Right Arm)   Pulse 78   Temp 98.3 F (36.8 C) (Oral)   Resp 16   Ht 6\' 1"  (1.854 m)   Wt 281 lb 15.5 oz (127.9 kg)   SpO2 96%   BMI 37.20 kg/m  General: Alert, oriented, no distress.  Skin: normal turgor, no rashes, warm and dry HEENT: Normocephalic, atraumatic. Pupils equal round and reactive to light; sclera anicteric; extraocular muscles intact;  Nose without nasal septal hypertrophy Mouth/Parynx benign; Mallinpatti scale 3 Neck: No JVD, no  carotid bruits; normal carotid upstroke Lungs: clear to ausculatation and percussion; no wheezing or rales Chest wall: without tenderness to palpitation Heart: PMI not displaced, RRR, s1 s2 normal, 1/6 systolic murmur, no diastolic murmur, no rubs, gallops, thrills, or heaves Abdomen: soft, nontender; no hepatosplenomehaly, BS+; abdominal aorta nontender and not dilated by palpation. Back: no CVA tenderness Pulses 2+ Stable R femoral cath site Musculoskeletal: full range of motion, normal strength, no joint deformities Extremities: no clubbing cyanosis or edema, Homan's sign negative  Neurologic: grossly nonfocal; Cranial nerves grossly wnl Psychologic: Normal mood and affect   Labs    Chemistry Recent Labs Lab 02/18/17 1345 02/19/17 0519 02/20/17 0135  NA 135 138 138  K 5.8* 3.9 3.9  CL 105 106 105  CO2 24 22 25   GLUCOSE 145* 170* 151*  BUN 13 9 12   CREATININE 1.13 0.95 1.07  CALCIUM 9.3 9.2 9.6  GFRNONAA >60 >60 >60  GFRAA >60 >60 >60  ANIONGAP 6 10 8  Hematology Recent Labs Lab 02/18/17 1345 02/19/17 0519  WBC 11.3* 11.2*  RBC 5.12 5.15  HGB 18.0* 17.5*  HCT 49.8 50.2  MCV 97.3 97.5  MCH 35.2* 34.0  MCHC 36.1* 34.9  RDW 12.8 13.1  PLT 211 180    Cardiac Enzymes Recent Labs Lab 02/19/17 1203  TROPONINI 2.05*    Recent Labs Lab 02/18/17 1355  TROPIPOC 0.08     BNPNo results for input(s): BNP, PROBNP in the last 168 hours.   DDimer No results for input(s): DDIMER in the last 168 hours.   Lipid Panel     Component Value Date/Time   CHOL 188 02/20/2017 0135   TRIG 501 (H) 02/20/2017 0135   HDL 24 (L) 02/20/2017 0135   CHOLHDL 7.8 02/20/2017 0135   VLDL UNABLE TO CALCULATE IF TRIGLYCERIDE OVER 400 mg/dL 16/03/9603 5409   LDLCALC UNABLE TO CALCULATE IF TRIGLYCERIDE OVER 400 mg/dL 81/19/1478 2956    Radiology    Dg Chest 2 View  Result Date: 02/18/2017 CLINICAL DATA:  Chest pain EXAM: CHEST  2 VIEW COMPARISON:  None. FINDINGS: Intact  sternotomy wires. CABG clips overlie the left mediastinum. Normal heart size. Normal mediastinal contour. No pneumothorax. No pleural effusion. Lungs appear clear, with no acute consolidative airspace disease and no pulmonary edema. IMPRESSION: No active cardiopulmonary disease. Electronically Signed   By: Delbert Phenix M.D.   On: 02/18/2017 14:21    Cardiac Studies   Conclusion     Mid Cx to Dist Cx lesion, 30 %stenosed.  2nd Mrg lesion, 100 %stenosed.  Ost Ramus to Ramus lesion, 100 %stenosed.  Prox LAD to Mid LAD lesion, 40 %stenosed.  Prox LAD lesion, 70 %stenosed.  Prox RCA lesion, 40 %stenosed.  A drug eluting stent was successfully placed.  Ost RPDA lesion, 100 %stenosed.  Post intervention, there is a 0% residual stenosis.  SVG.  Origin lesion, 100 %stenosed.  LIMA and is anatomically normal.   1. Severe underlying three-vessel coronary artery disease with patent LIMA to LAD and patent stent in the left circumflex. Occluded ramus stent appears to be chronic and occluded SVG to what seems to be a ramus branch which is also chronic. There is acute occlusion of the ostial right PDA which is the culprit for inferior ST elevation myocardial infarction. No obvious patent graft to the right coronary artery.  2. Normal left ventricular end-diastolic pressure. Left ventricular angiography was not performed due to mildly elevated potassium. 3. Successful angioplasty and drug-eluting stent placement to the right PDA.  Recommendations: Dual antiplatelet therapy for at least one year. I added high dose atorvastatin. I ordered an echocardiogram to evaluate LV systolic function. Smoking cessation is strongly advised.           Post-Intervention Diagram     Patient Profile     46 y.o. 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   Assessment & Plan    1. Day 2 s/p STEMI with PCI to  native occluded RCA. Cath data as above with occluded SVG to ramus and occluded OM2 vesssels.  No recurrent cp; now on DAPT with ASA/Brilinta, metoprolol XL100 mg, losartan 25 mg. Imdur 30 mg added with concomitant CAD and sources of ischemia.   2. HTN: now controlled with BP Titrate losartan as outpatient.  3. HLD: Lipid panel as above with TG > 500: Atorvastatin 80 mg started at cath; add lovaza 2 grams  bid; improve diet; may need fenofibrate.  4. Tobacco abuse: Must DC completely.  OK for dc today. To set up with a new cardiologist that his wife sees in [place]. Should be seen next week.   Signed, Lennette Bihari, MD, [facility] 02/20/2017, 12:10 PM

## 2017-02-20 NOTE — Discharge Instructions (Signed)
Acute Coronary Syndrome °Acute coronary syndrome (ACS) is a serious problem in which there is suddenly not enough blood and oxygen supplied to the heart. ACS may mean that one or more of the blood vessels in your heart (coronary arteries) may be blocked. ACS can result in chest pain or a heart attack (myocardial infarction or MI). °What are the causes? °This condition is caused by atherosclerosis, which is the buildup of fat and cholesterol (plaque) on the inside of the arteries. Over time, the plaque may narrow or block the artery, and this will lessen blood flow to the heart. Plaque can also become weak and break off within a coronary artery to form a clot and cause a sudden blockage. °What increases the risk? °The risk factors of this condition include: °· High cholesterol levels. °· High blood pressure (hypertension). °· Smoking. °· Diabetes. °· Age. °· Family history of chest pain, heart disease, or stroke. °· Lack of exercise. °What are the signs or symptoms? °The most common signs of this condition include: °· Chest pain, which can be: °¨ A crushing or squeezing in the chest. °¨ A tightness, pressure, fullness, or heaviness in the chest. °¨ Present for more than a few minutes, or it can stop and recur. °· Pain in the arms, neck, jaw, or back. °· Unexplained heartburn or indigestion. °· Shortness of breath. °· Nausea. °· Sudden cold sweats. °· Feeling light-headed or dizzy. °Sometimes, this condition has no symptoms. °How is this diagnosed? °ACS may be diagnosed through the following tests: °· Electrocardiogram (ECG). °· Blood tests. °· Coronary angiogram. This is a procedure to look at the coronary arteries to see if there is any blockage. °How is this treated? °Treatment for ACS may include: °· Healthy behavioral changes to reduce or control risk factors. °· Medicine. °· Coronary stenting. A stent helps to keep an artery open. °· Coronary angioplasty. This procedure widens a narrowed or blocked  artery. °· Coronary artery bypass surgery. This will allow your blood to pass the blockage (bypass) to reach your heart. °Follow these instructions at home: °Eating and drinking °· Follow a heart-healthy diet. A dietitian can you help to educate you about healthy food options and changes. °· Use healthy cooking methods such as roasting, grilling, broiling, baking, poaching, steaming, or stir-frying. Talk to a dietitian to learn more about healthy cooking methods. °Medicines °· Take medicines only as directed by your health care provider. °· Do not take the following medicines unless your health care provider approves: °¨ Nonsteroidal anti-inflammatory drugs (NSAIDs), such as ibuprofen, naproxen, or celecoxib. °¨ Vitamin supplements that contain vitamin A, vitamin E, or both. °¨ Hormone replacement therapy that contains estrogen with or without progestin. °· Stop illegal drug use. °Activity °· Follow an exercise program that is approved by your health care provider. °· Plan rest periods when you are fatigued. °Lifestyle °· Do not use any tobacco products, including cigarettes, chewing tobacco, or electronic cigarettes. If you need help quitting, ask your health care provider. °· If you drink alcohol, and your health care provider approves, limit your alcohol intake to no more than 1 drink per day. One drink equals 12 ounces of beer, 5 ounces of wine, or 1½ ounces of hard liquor. °· Learn to manage stress. °· Maintain a healthy weight. Lose weight as approved by your health care provider. °General instructions °· Manage other health conditions, such as hypertension and diabetes, as directed by your health care provider. °· Keep all follow-up visits as directed by your   health care provider. This is important. °· Your health care provider may ask you to monitor your blood pressure. A blood pressure reading consists of a higher number over a lower number, such as 110 over 72, written as 110/72. Ideally, your blood  pressure should be: °¨ Below 140/90 if you have no other medical conditions. °¨ Below 130/80 if you have diabetes or kidney disease. °Get help right away if: °· You have pain in your chest, neck, arm, jaw, stomach, or back that lasts more than a few minutes, is recurring, or is not relieved by taking medicine under your tongue (sublingual nitroglycerin). °· You have profuse sweating without cause. °· You have unexplained: °¨ Heartburn or indigestion. °¨ Shortness of breath or difficulty breathing. °¨ Nausea or vomiting. °¨ Fatigue. °¨ Feelings of nervousness or anxiety. °¨ Weakness. °¨ Diarrhea. °· You have sudden light-headedness or dizziness. °· You faint. °These symptoms may represent a serious problem that is an emergency. Do not wait to see if the symptoms will go away. Get medical help right away. Call your local emergency services (911 in the [place] Do not drive yourself to the clinic or hospital.  °This information is not intended to replace advice given to you by your health care provider. Make sure you discuss any questions you have with your health care provider. °Document Released: 06/09/2005 Document Revised: 11/21/2015 Document Reviewed: 10/11/2013 °Elsevier Interactive Patient Education © 2017 [employer] ° °

## 2017-02-20 NOTE — Progress Notes (Signed)
CARDIAC REHAB PHASE I   PRE:  Rate/Rhythm: 56 SB    BP: sitting 119/68    SaO2:   MODE:  Ambulation: 860 ft   POST:  Rate/Rhythm: 82 SR    BP: sitting 135/87     SaO2:   Tolerated well, no c/o. Discussed smoking cessation further. Also discussed pts triglycerides (500). Pt drinks 12 20 oz Mountain Dews a day. Pt is ready to change and sts it will be no problem. Reinforced Brilinta. [phone]   Harriet Massonandi Kristan Jeralynn Vaquera CES, ACSM 02/20/2017 8:54 AM

## 2017-02-20 NOTE — Discharge Summary (Signed)
Discharge Summary    Patient ID: Danny Huber,  MRN: [id number], DOB/AGE: 1971-02-27 46 y.o.  Admit date: 02/18/2017 Discharge date: 02/20/2017  Primary Care Provider: Patient, No Pcp Per Primary Cardiologist: Not following with anyone in particular right now, lives in [place] - states he stopped seeing his prior cardiologist due to dissatisfaction.  Discharge Diagnoses    Principal Problem:   Acute ST elevation myocardial infarction (STEMI) of inferior wall (HCC) Active Problems:   CAD (coronary artery disease)   Tobacco use   Hypertriglyceridemia   Hypertension   Hyperlipidemia    Diagnostic Studies/Procedures    Procedures   Coronary/Graft Acute MI Revascularization  LEFT HEART CATH AND CORONARY ANGIOGRAPHY  Conclusion     Mid Cx to Dist Cx lesion, 30 %stenosed.  2nd Mrg lesion, 100 %stenosed.  Ost Ramus to Ramus lesion, 100 %stenosed.  Prox LAD to Mid LAD lesion, 40 %stenosed.  Prox LAD lesion, 70 %stenosed.  Prox RCA lesion, 40 %stenosed.  A drug eluting stent was successfully placed.  Ost RPDA lesion, 100 %stenosed.  Post intervention, there is a 0% residual stenosis.  SVG.  Origin lesion, 100 %stenosed.  LIMA and is anatomically normal.   1. Severe underlying three-vessel coronary artery disease with patent LIMA to LAD and patent stent in the left circumflex. Occluded ramus stent appears to be chronic and occluded SVG to what seems to be a ramus branch which is also chronic. There is acute occlusion of the ostial right PDA which is the culprit for inferior ST elevation myocardial infarction. No obvious patent graft to the right coronary artery.  2. Normal left ventricular end-diastolic pressure. Left ventricular angiography was not performed due to mildly elevated potassium. 3. Successful angioplasty and drug-eluting stent placement to the right PDA.  Recommendations: Dual antiplatelet therapy for at least one year. I added high dose  atorvastatin. I ordered an echocardiogram to evaluate LV systolic function. Smoking cessation is strongly advised.   Indications   Acute ST elevation myocardial infarction (STEMI) involving right coronary artery (HCC) [I21.11 (ICD-10-CM)]  Procedural Details/Technique   Technical Details Procedural Details: the right groin was prepped in a sterile fashion. It was anesthetized with 1% lidocaine. A 6 French sheath was placed in the right femoral artery. Coronary angiography was performed with standard Judkins catheters. LV pressure was obtained with the JR4 guide. Left ventricular angiography was not performed. Catheter exchanges were performed over an exchange length guidewire. There were no immediate procedural complications.  PCI Note: Following the diagnostic procedure, the decision was made to proceed with PCI. Weight-based bivalirudin was given for anticoagulation. Once a therapeutic ACT was achieved, a 6 [place] JR4 guide catheter was inserted. A run through coronary guidewire was used to cross the lesion. The lesion was predilated with a 2.5 x 12 mm balloon. The lesion was then stented with a 3.0 x 18 mm Xience Sierra drug-eluting stent. The stent was postdilated with a 3.25 x 12 mm noncompliant balloon. Following PCI, there was 0% residual stenosis and TIMI-3 flow. Final angiography confirmed an excellent result. The patient tolerated the procedure well. There were no immediate procedural complications. The patient was transferred to the post catheterization recovery area for further monitoring.   Estimated blood loss <50 mL.  During this procedure the patient was administered the following to achieve and maintain moderate conscious sedation: Versed 1 mg, Fentanyl 25 mcg, while the patient's heart rate, blood pressure, and oxygen saturation were continuously monitored. The period of conscious sedation was 51 minutes,  of which I was present face-to-face 100% of this time.    Coronary Findings     Dominance: Right  Left Main  Vessel is angiographically normal.  Left Anterior Descending  Prox LAD lesion, 70% stenosed.  Prox LAD to Mid LAD lesion, 40% stenosed.  Ramus Intermedius  Ost Ramus to Ramus lesion, 100% stenosed. The lesion was previously treated.  Left Circumflex  Mid Cx to Dist Cx lesion, 30% stenosed. The lesion was previously treated.  Second Obtuse Marginal Branch  Vessel is small in size.  2nd Mrg lesion, 100% stenosed.  Third Obtuse Marginal Branch  There is mild disease in the vessel.  Right Coronary Artery  Prox RCA lesion, 40% stenosed.  Right Posterior Descending Artery  Ost RPDA lesion, 100% stenosed. The lesion is thrombotic.  Angioplasty: Lesion crossed with guidewire. Pre-stent angioplasty was performed. A drug eluting stent was successfully placed. There is no pre-interventional antegrade distal flow (TIMI 0). The post-interventional distal flow is normal (TIMI 3). The intervention was successful . No complications occurred at this lesion.  There is no residual stenosis post intervention.  Right Posterior Atrioventricular Branch  Vessel is angiographically normal.  First Right Posterolateral  There is mild disease in the vessel.  Second Right Posterolateral  The vessel exhibits minimal luminal irregularities.  Third Right Posterolateral  Vessel is small in size. The vessel exhibits minimal luminal irregularities.  Graft Angiography  saphenous Graft to Ramus  SVG.  Origin lesion, 100% stenosed.  Free LIMA Graft to Dist LAD  LIMA and is anatomically normal.  Coronary Diagrams   Diagnostic Diagram       Post-Intervention Diagram           2D Echo 02/19/17 Study Conclusions  - Left ventricle: The cavity size was normal. There was moderate   concentric hypertrophy. Systolic function was normal. The   estimated ejection fraction was in the range of 50% to 55%. Wall   motion was normal; there were no regional wall motion    abnormalities. Doppler parameters are consistent with abnormal   left ventricular relaxation (grade 1 diastolic dysfunction). - Aortic valve: Transvalvular velocity was within the normal range.   There was no stenosis. There was no regurgitation. - Mitral valve: Transvalvular velocity was within the normal range.   There was no evidence for stenosis. There was no regurgitation. - Right ventricle: The cavity size was normal. Wall thickness was   normal. Systolic function was normal. - Atrial septum: No defect or patent foramen ovale was identified   by color flow Doppler. - Tricuspid valve: There was no regurgitation. _____________     History of Present Illness     Danny Huber is a 46 y.o. male with history of CAD s/p CABG 12/2015 with ablation (details unclear), HTN, tobacco abuse, obesity who was admitted with a STEMI. He is a [employer] and was traveling through [place]. He developed chest pain this morning around 6am that awoke him from sleep and worsened when he was getting ready to take a shower. The pain persisted which prompted him to call EMS. Initial EKG on arrival showed SR with no acute ST changes. Repeat EKG at 1452 showed SR with ST elevation in the inferior leads. Code STEMI called in the ED. He was given 324mg  ASA, 4000 units of heparin, IV morphine, IV NTG, and brought emergently to the cath lab.    Hospital Course    1. CAD/STEMI: peak troponin was 2.05. Cardiac cath showed severe native disease with  patent LIMA-LAD and patent stent to the LCx. Occluded ramus stent appeared to be chronic and occluded SVG to what seems to be a ramus branch also appeared chronic. There was acute occlusion of the ostial right PDA which was felt to be the culprit for inferior ST elevation myocardial infarction. There was no obvious patent graft to the right coronary artery. He underwent successful DES to right PDA. It was recommended to continue DAPT for at least 1 year. Statin was added. He  was continued on high dose metoprolol as per home prescription. The interventional team did not resume sotalol due to unclear indication and concomitant Toprol use. Rhythm was NSR during admission. Smoking cessation was advised. He received 30 day free Brilinta rx in addition to regular prescriptions. 2D echo showed EF 50-55%, grade 1 DD. Imdur was added with concomitant CAD and sources of ischemia.  2. HTN: med titrated to regimen below. Diastolic running on higher side rarely but for the most part largely controlled today.  3. Hyperlipidemia: Lipid panel was deranged with triglycerides 501, HDL 24, unable to calculate LDL. above with TG > 500: Atorvastatin 80 mg started at cath. Dr. Tresa EndoKelly recommended to add Lovaza 2g BID and improve diet. If the patient is tolerating meds at time of follow-up appointment, would consider rechecking liver function/lipid panel in 6 weeks. Patient advised of need for this.  4. Tobacco abuse: complete cessation advised.  5. Excessive caffeine/soda intake - patient reports drinking [address]12 20oz Mountain Dews a day. He was advised on importance of cutting down significantly and making healthy lifestyle changes.  The patient feels well today without complaint. He was seen by cardiac rehab for education. Pt gave them the names of the hospitals in [place] so they will make CRP 2 referral to one of them. The patient's wife actually has had bypass as well and has a cardiologist so the patient plans to contact their office to establish care. Per his report she works as an NP and should not have a problem helping him get in to see them. He previously did not follow up with a prior cardiologist due to poor perceived relationship. It was recommended he be seen within 1 week of discharge. Also left our contact information on his discharge paperwork should he have any issues in the interim. He [facility] for him to fill  prior to leaving [place]. A family member will be coming to take him back to [place] at discharge. 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 Dr. Tresa EndoKelly has seen and examined the patient today and feels he is stable for discharge. _____________  Discharge Vitals Blood pressure (!) 117/96, pulse 74, temperature (!) 97.5 F (36.4 C), temperature source Oral, resp. rate 16, height 6\' 1"  (1.854 m), weight 281 lb 15.5 oz (127.9 kg), SpO2 95 %.  Filed Weights   02/18/17 1335 02/18/17 1706  Weight: 282 lb (127.9 kg) 281 lb 15.5 oz (127.9 kg)    Labs & Radiologic Studies    CBC  Recent Labs  02/18/17 1345 02/19/17 0519  WBC 11.3* 11.2*  HGB 18.0* 17.5*  HCT 49.8 50.2  MCV 97.3 97.5  PLT 211 180   Basic Metabolic Panel  Recent Labs  02/19/17 0519 02/20/17 0135  NA 138 138  K 3.9 3.9  CL 106 105  CO2 22 25  GLUCOSE 170* 151*  BUN 9 12  CREATININE 0.95 1.07  CALCIUM 9.2 9.6   Cardiac Enzymes  Recent Labs  02/19/17 1203  TROPONINI 2.05*   Fasting Lipid Panel  Recent Labs  02/20/17 0135  CHOL 188  HDL 24*  LDLCALC UNABLE TO CALCULATE IF TRIGLYCERIDE OVER 400 mg/dL  TRIG 161*  CHOLHDL 7.8   Thyroid Function Tests No results for input(s): TSH, T4TOTAL, T3FREE, THYROIDAB in the last 72 hours.  Invalid input(s): FREET3 _____________  Dg Chest 2 View  Result Date: 02/18/2017 CLINICAL DATA:  Chest pain EXAM: CHEST  2 VIEW COMPARISON:  None. FINDINGS: Intact sternotomy wires. CABG clips overlie the left mediastinum. Normal heart size. Normal mediastinal contour. No pneumothorax. No pleural effusion. Lungs appear clear, with no acute consolidative airspace disease and no pulmonary edema. IMPRESSION: No active cardiopulmonary disease. Electronically Signed   By: Delbert Phenix M.D.   On: 02/18/2017 14:21   Disposition   Pt is being discharged home today in good  condition.  Follow-up Plans & Appointments    Follow[facility]up Information    [facility]   Why:  Please pick up prescriptions at above location Contact information: [address] [place] [address]  [phone]       Lennette Bihari, MD Follow up.   Specialty:  Cardiology Why:  If you have any questions whatsoever about your hospitalization, do not hesitate to call us. Please see cardiologist within 1 week of discharge. Contact information: [address] [place] [address] [phone]          Discharge Instructions    AMB Referral to Cardiac Rehabilitation - Phase II    Complete by:  As directed    Diagnosis:   STEMI Coronary Stents     Amb Referral to Cardiac Rehabilitation    Complete by:  As directed    Will refer to program in [facility]   Diagnosis:   Coronary Stents STEMI     Diet - low sodium heart healthy    Complete by:  As directed    Increase activity slowly    Complete by:  As directed    No personal driving for 2 weeks. No lifting over 10 lbs for 4 weeks. No sexual activity for 4 weeks. 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 Keep procedure site clean & dry. If you notice increased pain, swelling, bleeding or pus, call/return!  You may shower, but no soaking baths/hot tubs/pools for 1 week.  Because you were started on cholesterol medication, you should have follow-up labwork in about 6 weeks.      Discharge Medications   Allergies as of 02/20/2017   No Known Allergies     Medication List    STOP taking these medications   isosorbide dinitrate 30 MG tablet Commonly known as:  ISORDIL   sotalol 80 MG tablet Commonly known as:  BETAPACE     TAKE these medications   aspirin EC 81 MG tablet Take 81 mg by mouth daily.   isosorbide mononitrate 30 MG 24 hr tablet Commonly known as:  IMDUR Take 1 tablet (30 mg total) by mouth daily.   losartan 25 MG tablet Commonly known  as:  COZAAR Take 1 tablet (25 mg total) by mouth daily.   metoprolol succinate 100 MG 24 hr tablet Commonly known as:  TOPROL-XL Take 100 mg by mouth daily. Take with or immediately following a meal.   nitroGLYCERIN 0.4  MG SL tablet Commonly known as:  NITROSTAT Place 1 tablet (0.4 mg total) under the tongue every 5 (five) minutes as needed for chest pain (up to 3 doses).   omega-3 acid ethyl esters 1 g capsule Commonly known as:  LOVAZA Take 2 capsules (2 g total) by mouth 2 (two) times daily.   rosuvastatin 40 MG tablet Commonly known as:  CRESTOR Take 1 tablet (40 mg total) by mouth every evening.   ticagrelor 90 MG Tabs tablet Commonly known as:  BRILINTA Take 1 tablet (90 mg total) by mouth 2 (two) times daily.            Discharge Care Instructions        Start     Ordered   02/21/17 0000  losartan (COZAAR) 25 MG tablet  Daily    Question:  Supervising Provider  Answer:  Dolores Patty   02/20/17 1323   02/20/17 0000  isosorbide mononitrate (IMDUR) 30 MG 24 hr tablet  Daily    Question:  Supervising Provider  Answer:  Arvilla Meres R   02/20/17 1323   02/20/17 0000  rosuvastatin (CRESTOR) 40 MG tablet  Every evening    Question:  Supervising Provider  Answer:  Arvilla Meres R   02/20/17 1323   02/20/17 0000  omega-3 acid ethyl esters (LOVAZA) 1 g capsule  2 times daily    Question:  Supervising Provider  Answer:  Dolores Patty   02/20/17 1323   02/20/17 0000  ticagrelor (BRILINTA) 90 MG TABS tablet  2 times daily    Question:  Supervising Provider  Answer:  Arvilla Meres R   02/20/17 1323   02/20/17 0000  nitroGLYCERIN (NITROSTAT) 0.4 MG SL tablet  Every 5 min PRN    Question:  Supervising Provider  Answer:  Dolores Patty   02/20/17 1323   02/20/17 0000  Diet - low sodium heart healthy     02/20/17 1323   02/20/17 0000  Increase activity slowly     02/20/17 1327   02/19/17 0000  Amb Referral to Cardiac Rehabilitation      Comments:  Will refer to program in [facility]  Question Answer Comment  Diagnosis: Coronary Stents   Diagnosis: STEMI      02/19/17 1103   02/18/17 0000  AMB Referral to Cardiac Rehabilitation - Phase II    Question Answer Comment  Diagnosis: STEMI   Diagnosis: Coronary Stents      02/18/17 1635       Allergies:  No Known Allergies  Aspirin prescribed at discharge?  Yes High Intensity Statin Prescribed? (Lipitor 40-80mg  or Crestor 20-40mg ): Yes Beta Blocker Prescribed? Yes For EF <40%, was ACEI/ARB Prescribed? No: na ADP Receptor Inhibitor Prescribed? (i.e. Plavix etc.-Includes Medically Managed Patients): Yes For EF <40%, Aldosterone Inhibitor Prescribed? No: na Was EF assessed during THIS hospitalization? Yes Was Cardiac Rehab II ordered? (Included Medically managed Patients): Yes   Outstanding Labs/Studies   N/a  Duration of Discharge Encounter   Greater than 30 minutes including physician time.  Signed, Tacey Ruiz Alando Colleran PA-C 02/20/2017, 1:27 PM

## 2017-04-14 ENCOUNTER — Other Ambulatory Visit: Payer: Self-pay | Admitting: Physician Assistant

## 2018-10-23 IMAGING — DX DG CHEST 2V
2 series · 2 of 2 positions shown · non-contrast
Comparison: None.

CLINICAL DATA: Chest pain

EXAM:
CHEST  2 VIEW

[chest pa]
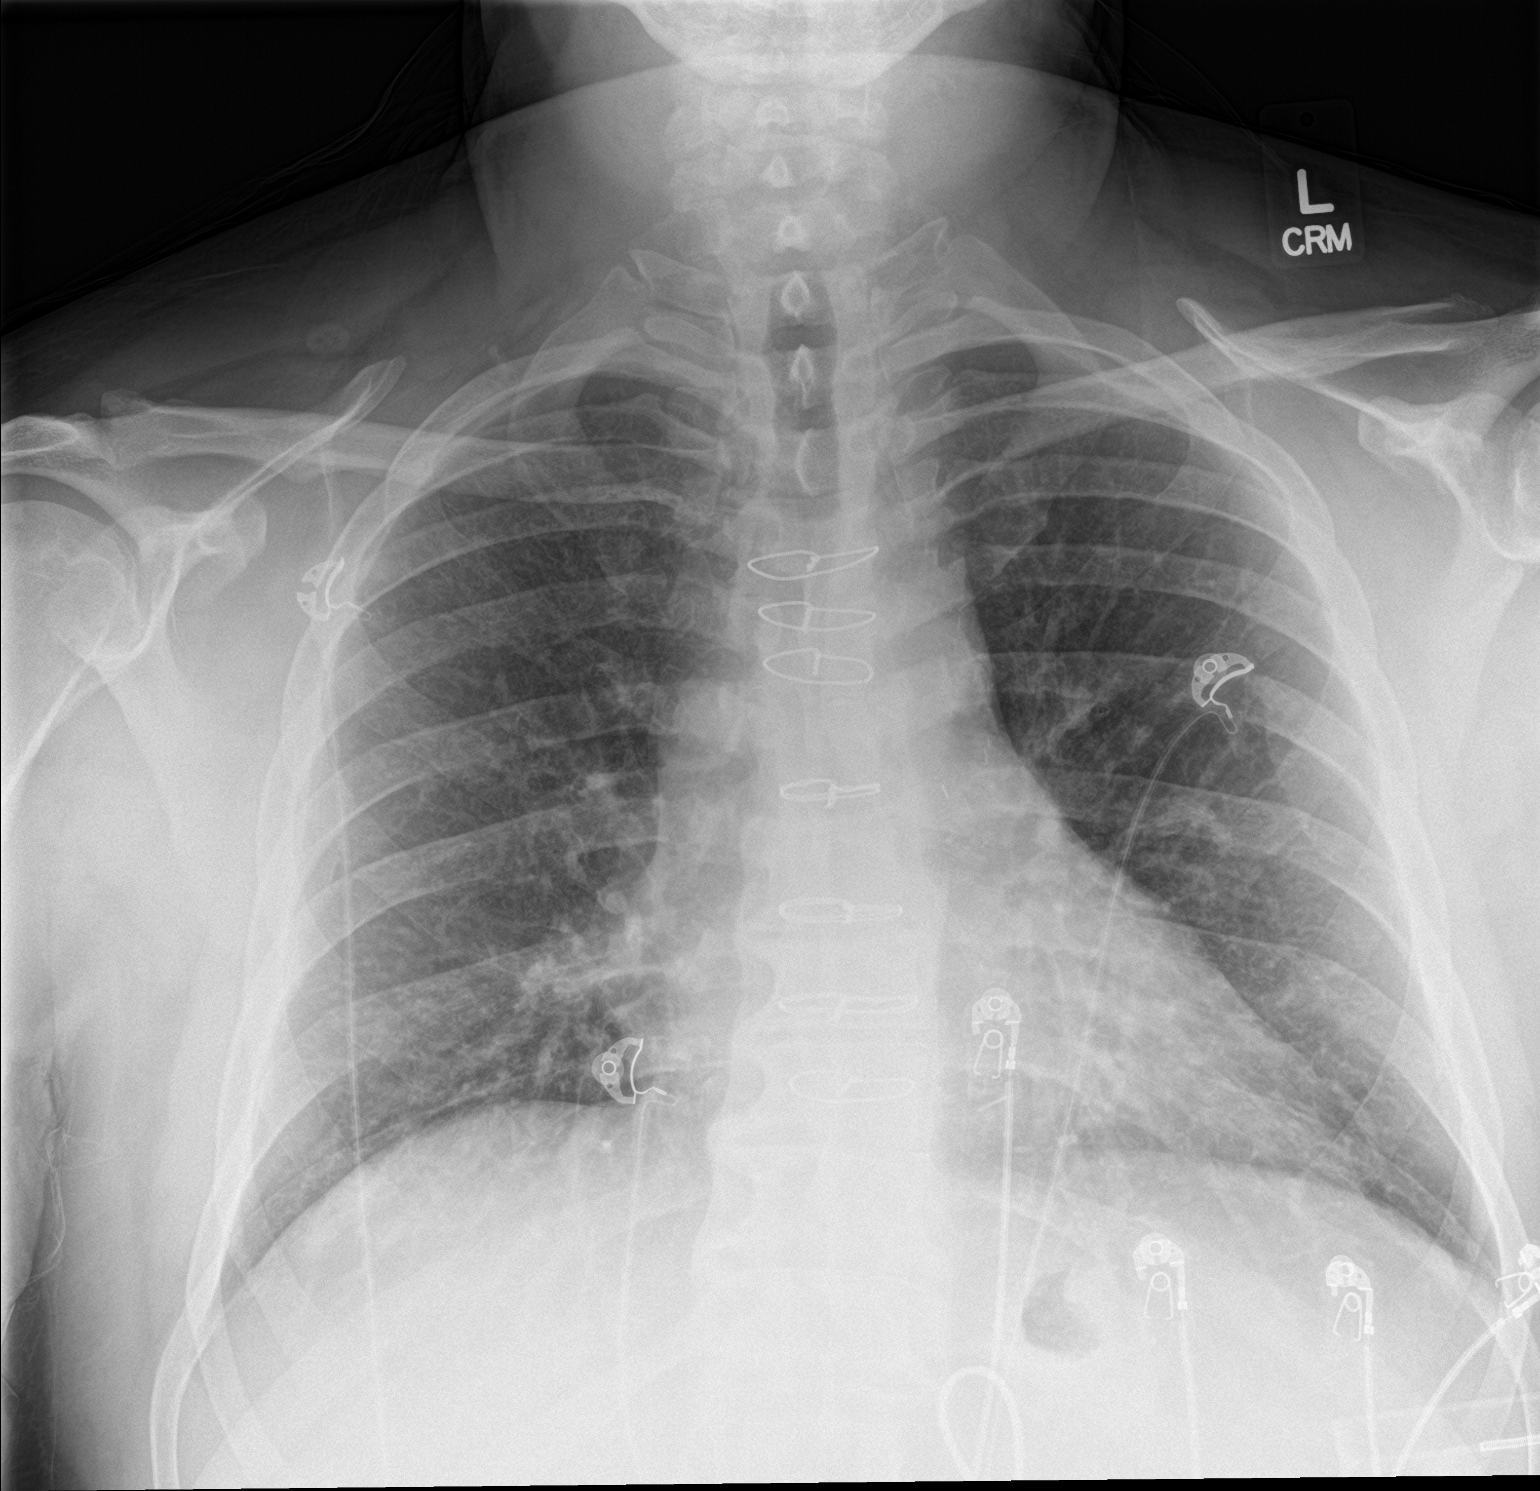

[chest lat]
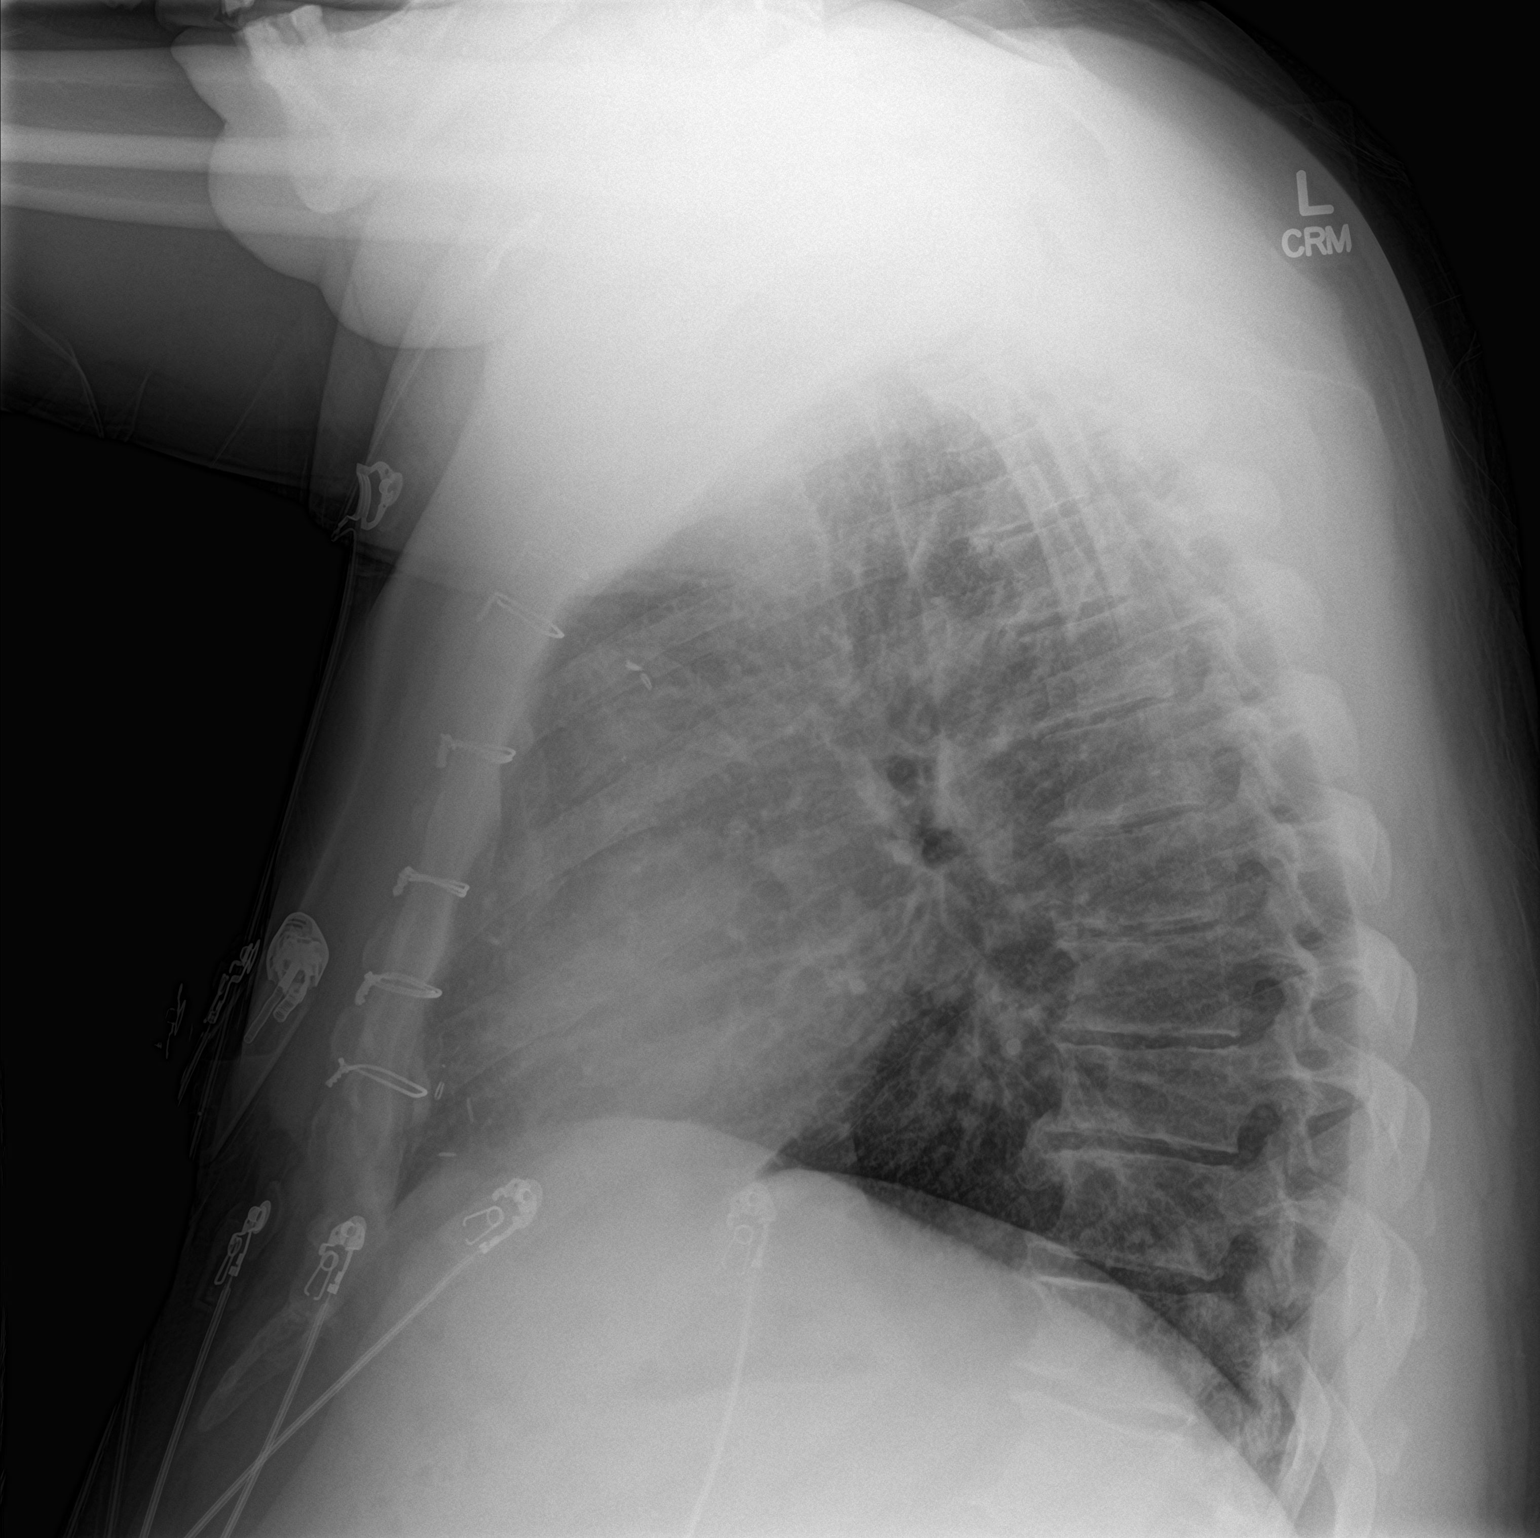

[2 of 2 positions shown; findings below may reference images not displayed]

FINDINGS: Intact sternotomy wires. CABG clips overlie the left mediastinum.
Normal heart size. Normal mediastinal contour. No pneumothorax. No
pleural effusion. Lungs appear clear, with no acute consolidative
airspace disease and no pulmonary edema.
IMPRESSION: No active cardiopulmonary disease.
